# Patient Record
Sex: Female | Born: 1940
Health system: Southern US, Community
[De-identification: ages and names within clinical notes are randomized; demographics above are authoritative.]

## PROBLEM LIST (undated history)

## (undated) DIAGNOSIS — M549 Dorsalgia, unspecified: Secondary | ICD-10-CM

## (undated) DIAGNOSIS — I10 Essential (primary) hypertension: Secondary | ICD-10-CM

## (undated) DIAGNOSIS — E119 Type 2 diabetes mellitus without complications: Secondary | ICD-10-CM

## (undated) HISTORY — PX: ABDOMINAL HYSTERECTOMY: SHX81

## (undated) HISTORY — PX: CHOLECYSTECTOMY: SHX55

---

## 2018-02-08 ENCOUNTER — Emergency Department (HOSPITAL_BASED_OUTPATIENT_CLINIC_OR_DEPARTMENT_OTHER)
Admission: EM | Admit: 2018-02-08 | Discharge: 2018-02-08 | Disposition: A | Discharge status: Home / Self Care | Payer: Medicare Other | Attending: Emergency Medicine | Admitting: Emergency Medicine

## 2018-02-08 ENCOUNTER — Emergency Department (HOSPITAL_BASED_OUTPATIENT_CLINIC_OR_DEPARTMENT_OTHER): Payer: Medicare Other

## 2018-02-08 ENCOUNTER — Encounter (HOSPITAL_BASED_OUTPATIENT_CLINIC_OR_DEPARTMENT_OTHER): Payer: Self-pay | Admitting: *Deleted

## 2018-02-08 ENCOUNTER — Other Ambulatory Visit: Payer: Self-pay

## 2018-02-08 DIAGNOSIS — S3992XA Unspecified injury of lower back, initial encounter: Secondary | ICD-10-CM | POA: Diagnosis present

## 2018-02-08 DIAGNOSIS — I10 Essential (primary) hypertension: Secondary | ICD-10-CM | POA: Insufficient documentation

## 2018-02-08 DIAGNOSIS — Y9389 Activity, other specified: Secondary | ICD-10-CM | POA: Diagnosis not present

## 2018-02-08 DIAGNOSIS — Y999 Unspecified external cause status: Secondary | ICD-10-CM | POA: Insufficient documentation

## 2018-02-08 DIAGNOSIS — X509XXA Other and unspecified overexertion or strenuous movements or postures, initial encounter: Secondary | ICD-10-CM | POA: Insufficient documentation

## 2018-02-08 DIAGNOSIS — Y929 Unspecified place or not applicable: Secondary | ICD-10-CM | POA: Diagnosis not present

## 2018-02-08 DIAGNOSIS — S39012A Strain of muscle, fascia and tendon of lower back, initial encounter: Secondary | ICD-10-CM | POA: Insufficient documentation

## 2018-02-08 HISTORY — DX: Essential (primary) hypertension: I10

## 2018-02-08 HISTORY — DX: Dorsalgia, unspecified: M54.9

## 2018-02-08 HISTORY — DX: Type 2 diabetes mellitus without complications: E11.9

## 2018-02-08 MED ORDER — HYDROMORPHONE HCL 1 MG/ML IJ SOLN
1.0000 mg | Freq: Once | INTRAMUSCULAR | Status: AC
  Administered 2018-02-08: 1 mg via INTRAMUSCULAR
  Filled 2018-02-08: qty 1

## 2018-02-08 MED ORDER — METHYLPREDNISOLONE ACETATE 80 MG/ML IJ SUSP
80.0000 mg | Freq: Once | INTRAMUSCULAR | Status: AC
  Administered 2018-02-08: 80 mg via INTRAMUSCULAR
  Filled 2018-02-08: qty 1

## 2018-02-08 MED ORDER — KETOROLAC TROMETHAMINE 60 MG/2ML IM SOLN
30.0000 mg | Freq: Once | INTRAMUSCULAR | Status: AC
  Administered 2018-02-08: 30 mg via INTRAMUSCULAR
  Filled 2018-02-08: qty 2

## 2018-02-08 MED ORDER — BACLOFEN 10 MG PO TABS: 10.0000 mg | ORAL_TABLET | Freq: Three times a day (TID) | ORAL | 0 refills | Status: AC

## 2018-02-08 MED FILL — BACLOFEN 10 MG TABLET: 10 | 10 days supply | Qty: 30 | Fill #0

## 2018-02-08 NOTE — ED Notes (Signed)
NAD at this time. Pt is stable and going home.  

## 2018-02-08 NOTE — ED Triage Notes (Signed)
Pt has had severe pain in left leg & back since Sunday evening

## 2018-02-08 NOTE — ED Provider Notes (Signed)
[facility] EMERGENCY DEPARTMENT Provider Note   CSN: [id number] Arrival date & time: 02/08/18  5621   History   Chief Complaint Chief Complaint  Patient presents with  . Back Pain  . Leg Pain    HPI Amanda Nolan is a 77 y.o. female with a PMH of HTN, T2DM, HLD, IBS, and osteoarthritis of multiple sites presenting to the ED with back pain and left leg pain for the last week. The pain started when she was cleaning a spot off of her carpet one week ago. The pain got worse over the weekend because she spent a lot of time sitting. She states that the pain was so bad last night that she "was in tears". The pain is "sharp" and "stabbing". The pain starts in her low back and radiates to her left hip and thigh. The pain does not radiate down to her foot. She denies any weakness, numbness, or tingling in her lower extremities. She took Tylenol #3 and a muscle relaxer last night, which helped her sleep. She has also tried stretching, which has helped a little. She states this same thing happened 3-4 years ago. She had an MRI done at the neurosurgeon's office and was told she had a pinched nerve. At that time, the pain resolved on its own without any medications or interventions. She denies any trauma or falls.  No past medical history on file.  There are no active problems to display for this patient.      OB History   None      Home Medications    Prior to Admission medications   Not on File    Family History No family history on file.  Social History Social History   Tobacco Use  . Smoking status: Not on file  Substance Use Topics  . Alcohol use: Not on file  . Drug use: Not on file     Allergies   Patient has no allergy information on record.   Review of Systems Review of Systems  Constitutional: Negative for activity change, chills and fever.  HENT: Negative for congestion and sore throat.   Respiratory: Negative for shortness of breath.     Cardiovascular: Negative for chest pain.  Gastrointestinal: Negative for nausea and vomiting.  Genitourinary: Negative for dysuria and frequency.  Musculoskeletal: Positive for back pain. Negative for neck pain and neck stiffness.  Skin: Negative for rash.  Neurological: Negative for weakness and numbness.  Psychiatric/Behavioral: Negative for confusion.    Physical Exam Updated Vital Signs There were no vitals taken for this visit.  Physical Exam  Constitutional: She is oriented to person, place, and time. She appears well-developed and well-nourished.  HENT:  Head: Normocephalic and atraumatic.  Eyes: Conjunctivae and EOM are normal.  Neck: Normal range of motion. Neck supple.  Cardiovascular: Normal rate.  Pulmonary/Chest: Effort normal.  Abdominal: She exhibits no distension.  Musculoskeletal: Normal range of motion. She exhibits no tenderness.  No midline tenderness to palpation of the back, +tenderness of the left paraspinal muscles of the lumbar spine, +point tenderness over the left SI joint  Neurological: She is alert and oriented to person, place, and time. She has normal strength. She displays normal reflexes. No cranial nerve deficit or sensory deficit. She exhibits normal muscle tone.  Straight leg raise negative bilaterally  Skin: Skin is warm and dry. Capillary refill takes less than 2 seconds. No rash noted.  Psychiatric: She has a normal mood and affect. Her behavior is normal.  Judgment and thought content normal.    ED Treatments / Results  Labs (all labs ordered are listed, but only abnormal results are displayed) Labs Reviewed - No data to display  EKG None  Radiology No results found.  Procedures Procedures (including critical care time)  Medications Ordered in ED Medications - No data to display   Initial Impression / Assessment and Plan / ED Course  I have reviewed the triage vital signs and the nursing notes.  Pertinent labs & imaging  results that were available during my care of the patient were reviewed by me and considered in my medical decision making (see chart for details).  77 year old female with back pain that radiates to the upper left leg. Likely exacerbation underlying osteoarthritis of the lumbar spine vs left SI joint dysfunction. No fevers, chills, severe back pain that wakes her up at night to suggest concerning pathology such a tumor or abscess. No trauma or falls to suggest fracture. Will obtain lumbar x-ray to rule out compression fracture. Treat with Toradol IM and depomedrol IM for acute pain.  10:45AM: Patient with increased pain after returning from x-ray. Will give Dilaudid IM x 1.  11:00AM: Lumbar x-ray with mild degenerative changes. Patient feeling more comfortable. She is safe for discharge home. Will prescribe muscle relaxer that she can use in addition to Ibuprofen and Tylenol. She will follow-up with her neurosurgeon as an outpatient.  Final Clinical Impressions(s) / ED Diagnoses   Final diagnoses:  None    ED Discharge Orders    None       Danh Bayus, Allyn Kenner, MD 02/08/18 1110    Raeford Razor, MD 02/08/18 1547

## 2018-02-08 NOTE — Discharge Instructions (Addendum)
It was so nice to meet you!  You came into the emergency department because you were having back and left leg pain. You may have some nerve irritation coming from your back. We gave you three shots today to help with your pain. We did some x-rays of your lower back which showed some mild arthritis. I have prescribed a muscle relaxer called Baclofen that you can use three times per day as needed. You should also use Ibuprofen, Tylenol, and heat.   Please make sure you follow-up with the neurosurgeon that you have seen in the past.

## 2018-02-10 ENCOUNTER — Emergency Department (HOSPITAL_BASED_OUTPATIENT_CLINIC_OR_DEPARTMENT_OTHER)
Admission: EM | Admit: 2018-02-10 | Discharge: 2018-02-10 | Disposition: A | Discharge status: Home / Self Care | Payer: Medicare Other | Attending: Emergency Medicine | Admitting: Emergency Medicine

## 2018-02-10 ENCOUNTER — Encounter (HOSPITAL_BASED_OUTPATIENT_CLINIC_OR_DEPARTMENT_OTHER): Payer: Self-pay | Admitting: *Deleted

## 2018-02-10 ENCOUNTER — Emergency Department (HOSPITAL_BASED_OUTPATIENT_CLINIC_OR_DEPARTMENT_OTHER): Payer: Medicare Other

## 2018-02-10 ENCOUNTER — Other Ambulatory Visit: Payer: Self-pay

## 2018-02-10 DIAGNOSIS — I10 Essential (primary) hypertension: Secondary | ICD-10-CM | POA: Diagnosis not present

## 2018-02-10 DIAGNOSIS — E1140 Type 2 diabetes mellitus with diabetic neuropathy, unspecified: Secondary | ICD-10-CM | POA: Insufficient documentation

## 2018-02-10 DIAGNOSIS — R600 Localized edema: Secondary | ICD-10-CM | POA: Insufficient documentation

## 2018-02-10 DIAGNOSIS — Z79899 Other long term (current) drug therapy: Secondary | ICD-10-CM | POA: Insufficient documentation

## 2018-02-10 DIAGNOSIS — M79605 Pain in left leg: Secondary | ICD-10-CM | POA: Diagnosis present

## 2018-02-10 DIAGNOSIS — M792 Neuralgia and neuritis, unspecified: Secondary | ICD-10-CM

## 2018-02-10 MED ORDER — FENTANYL CITRATE (PF) 100 MCG/2ML IJ SOLN
50.0000 ug | Freq: Once | INTRAMUSCULAR | Status: AC
  Administered 2018-02-10: 50 ug via INTRAMUSCULAR
  Filled 2018-02-10: qty 2

## 2018-02-10 MED ORDER — HYDROCODONE-ACETAMINOPHEN 5-325 MG PO TABS
1.0000 | ORAL_TABLET | Freq: Once | ORAL | Status: AC
  Administered 2018-02-10: 1 via ORAL
  Filled 2018-02-10: qty 1

## 2018-02-10 MED ORDER — PREDNISONE 10 MG PO TABS: 40.0000 mg | ORAL_TABLET | Freq: Every day | ORAL | 0 refills | Status: AC

## 2018-02-10 MED ORDER — GABAPENTIN 100 MG PO CAPS: 100.0000 mg | ORAL_CAPSULE | Freq: Three times a day (TID) | ORAL | 0 refills | Status: AC

## 2018-02-10 MED ORDER — PREDNISONE 50 MG PO TABS
60.0000 mg | ORAL_TABLET | Freq: Once | ORAL | Status: AC
  Administered 2018-02-10: 60 mg via ORAL
  Filled 2018-02-10: qty 1

## 2018-02-10 MED ORDER — GABAPENTIN 300 MG PO CAPS
300.0000 mg | ORAL_CAPSULE | Freq: Once | ORAL | Status: AC
  Administered 2018-02-10: 300 mg via ORAL
  Filled 2018-02-10: qty 1

## 2018-02-10 NOTE — ED Notes (Signed)
Nurse first-husband requested assist to get pt out of car-arrived to front passenger seat of car to find pt seated-c/o back pain-pt was able to get out of car without assist-stood and sat in w/c-taken in to ED [facility] lobby via w/c

## 2018-02-10 NOTE — ED Notes (Signed)
Patient transported to Ultrasound 

## 2018-02-10 NOTE — ED Notes (Signed)
ED Provider at bedside. 

## 2018-02-10 NOTE — ED Provider Notes (Signed)
[facility] EMERGENCY DEPARTMENT Provider Note   CSN: [id number] Arrival date & time: 02/10/18  1342     History   Chief Complaint Chief Complaint  Patient presents with  . Back Pain    HPI Amanda Nolan is a 77 y.o. female with past medical history significant for diabetes, hypertension, chronic back pain stenting with atraumatic left lower extremity pain and left lower back pain.  She explains that she was here 3 days ago and seen for same but it was not as severe.  She has been experiencing these episodes for years but this time has been worse.  She states that it started last week on Friday she had a family wedding in which she sat for prolonged period of time and on Sunday a recital she was seated for hours and started experiencing pain later that day.  Prior to this past weekend she was feeling very healthy and active without any ill symptoms.  She was able to manage the pain at home but on Wednesday she could not bear it anymore and came in to be evaluated in the emergency department.  She reports receiving 3 injections which helped her symptoms and was able to be discharged home.  She was prescribed muscle relaxant which she has been taking 3 times a day and has slept all day yesterday.  Today she states that she could not control the pain well enough to get rest and the pain has been unbearable.  She explains that her leg pain is what bothers her the most as it has never been this bad before.  Points to her left groin and inner thigh. Denies any fever, chills, loss of bowel or bladder function, numbness or weakness.   HPI  Past Medical History:  Diagnosis Date  . Back pain   . Diabetes mellitus without complication (HCC)   . Hypertension     There are no active problems to display for this patient.   Past Surgical History:  Procedure Laterality Date  . ABDOMINAL HYSTERECTOMY    . CHOLECYSTECTOMY       OB History   None      Home Medications    Prior  to Admission medications   Medication Sig Start Date End Date Taking? Authorizing Provider  atenolol (TENORMIN) 50 MG tablet Take 50 mg by mouth daily.    [provider]  baclofen (LIORESAL) 10 MG tablet Take 1 tablet (10 mg total) by mouth 3 (three) times daily. 02/08/18 02/08/19  Mayo, Allyn Kenner, MD  gabapentin (NEURONTIN) 100 MG capsule Take 1 capsule (100 mg total) by mouth 3 (three) times daily. 02/10/18   Georgiana Shore, PA-C  ibuprofen (ADVIL,MOTRIN) 800 MG tablet Take 800 mg by mouth every 8 (eight) hours as needed.    [provider]  predniSONE (DELTASONE) 10 MG tablet Take 4 tablets (40 mg total) by mouth daily for 4 days. 02/10/18 02/14/18  Georgiana Shore, PA-C    Family History History reviewed. No pertinent family history.  Social History Social History   Tobacco Use  . Smoking status: Never Smoker  . Smokeless tobacco: Never Used  Substance Use Topics  . Alcohol use: Not Currently  . Drug use: Never     Allergies   Metformin and related and Procardia [nifedipine]   Review of Systems Review of Systems  Constitutional: Negative for chills, diaphoresis, fatigue and fever.  Respiratory: Negative for chest tightness, shortness of breath and wheezing.   Cardiovascular: Positive for  leg swelling. Negative for chest pain and palpitations.  Gastrointestinal: Negative for abdominal distention, abdominal pain, diarrhea, nausea and vomiting.  Genitourinary: Negative for difficulty urinating, dysuria, flank pain, frequency and pelvic pain.  Musculoskeletal: Positive for arthralgias and back pain. Negative for joint swelling, myalgias, neck pain and neck stiffness.  Skin: Negative for color change, pallor and rash.  Neurological: Negative for weakness and numbness.     Physical Exam Updated Vital Signs BP (!) 159/62 (BP Location: Right Arm)   Pulse 65   Temp 98.4 F (36.9 C)   Resp 20   Ht  (1.549 m)   Wt 79.4 kg (175 lb)   SpO2 97%    BMI 33.07 kg/m   Physical Exam  Constitutional: She appears well-developed and well-nourished. No distress.  Afebrile, nontoxic-appearing, lying in bed in apparent discomfort.  HENT:  Head: Normocephalic and atraumatic.  Eyes: Conjunctivae are normal.  Neck: Normal range of motion. Neck supple.  Cardiovascular: Normal rate, regular rhythm, normal heart sounds and intact distal pulses.  No murmur heard. Pulmonary/Chest: Effort normal and breath sounds normal. No stridor. No respiratory distress. She has no wheezes. She has no rales.  Abdominal: She exhibits no distension.  Musculoskeletal: Normal range of motion. She exhibits edema and tenderness. She exhibits no deformity.  Left lower extremity appears more edematous than the right.  Tender to palpation of the left groin without bulging or evidence of hernia.  Tenderness palpation of the left calf.  Neurological: She is alert. No sensory deficit. She exhibits normal muscle tone.  5/5 strength in lower extremities bilaterally.  Sensation and proprioception intact, strong dorsalis pedis pulses, Neurovascularly intact.  Skin: Skin is warm and dry. No rash noted. She is not diaphoretic. No erythema. No pallor.  Psychiatric: She has a normal mood and affect.  Nursing note and vitals reviewed.    ED Treatments / Results  Labs (all labs ordered are listed, but only abnormal results are displayed) Labs Reviewed - No data to display  EKG None  Radiology US Venous Img Lower Unilateral Left  Result Date: 02/10/2018 CLINICAL DATA:  Left groin pain for 5 days. EXAM: LEFT LOWER EXTREMITY VENOUS DOPPLER ULTRASOUND TECHNIQUE: Gray-scale sonography with graded compression, as well as color Doppler and duplex ultrasound were performed to evaluate the lower extremity deep venous systems from the level of the common femoral vein and including the common femoral, femoral, profunda femoral, popliteal and calf veins including the posterior tibial,  peroneal and gastrocnemius veins when visible. The superficial great saphenous vein was also interrogated. Spectral Doppler was utilized to evaluate flow at rest and with distal augmentation maneuvers in the common femoral, femoral and popliteal veins. COMPARISON:  None. FINDINGS: Contralateral Common Femoral Vein: Respiratory phasicity is normal and symmetric with the symptomatic side. No evidence of thrombus. Normal compressibility. Common Femoral Vein: No evidence of thrombus. Normal compressibility, respiratory phasicity and response to augmentation. Saphenofemoral Junction: No evidence of thrombus. Normal compressibility and flow on color Doppler imaging. Profunda Femoral Vein: No evidence of thrombus. Normal compressibility and flow on color Doppler imaging. Femoral Vein: No evidence of thrombus. Normal compressibility, respiratory phasicity and response to augmentation. Popliteal Vein: No evidence of thrombus. Normal compressibility, respiratory phasicity and response to augmentation. Calf Veins: No evidence of thrombus. Normal compressibility and flow on color Doppler imaging. Superficial Great Saphenous Vein: No evidence of thrombus. Normal compressibility. Venous Reflux:  None. Other Findings:  None. IMPRESSION: Negative for deep venous thrombosis in left lower extremity. Electronically Signed  By: Richarda Overlie M.D.   On: 02/10/2018 18:08    Procedures Procedures (including critical care time)  Medications Ordered in ED Medications  HYDROcodone-acetaminophen (NORCO/VICODIN) 5-325 MG per tablet 1 tablet (1 tablet Oral Given 02/10/18 1547)  fentaNYL (SUBLIMAZE) injection 50 mcg (50 mcg Intramuscular Given 02/10/18 1608)  predniSONE (DELTASONE) tablet 60 mg (60 mg Oral Given 02/10/18 1847)  gabapentin (NEURONTIN) capsule 300 mg (300 mg Oral Given 02/10/18 1847)     Initial Impression / Assessment and Plan / ED Course  I have reviewed the triage vital signs and the nursing notes.  Pertinent labs &  imaging results that were available during my care of the patient were reviewed by me and considered in my medical decision making (see chart for details).    Patient presenting with left lower extremity pain and is a poor historian in obvious discomfort.  No injury or trauma. Recent visit 2 days ago with lumbar films showing mild degenerative disc disease, which patient reports being aware. Her symptoms started after a weekend consisting of prolonged sitting.  Patient is having difficulty clearly explain where her pain is located and points to her groin but reports pain that is generalized in the left lower extremity with calf tenderness. 5/5 strength in lower extremities bilaterally, extremities are warm with strong dorsalis pedis pulses and neurovascularly intact.  I do not suspect peripheral arterial disease.  Patient was provided with analgesia. She improved while in the ED. She was able to transfer from bed to wheel chair and use the bathroom without difficulties.  Will obtain ultrasound to rule out DVT of the left lower extremity.  Negative for DVT of the left LE.  Patients symptoms are consistent with neuropathic pain. Will give prednisone and start gabapentin. Patient was given initial dose in the emergency department.  Will discharge home with symptomatic relief and close follow-up with PCP.  Patient was discussed with Dr. Jeraldine Loots who has seen patient and agrees with assessment and plan. Final Clinical Impressions(s) / ED Diagnoses   Final diagnoses:  Neuropathic pain    ED Discharge Orders        Ordered    gabapentin (NEURONTIN) 100 MG capsule  3 times daily     02/10/18 1840    predniSONE (DELTASONE) 10 MG tablet  Daily     02/10/18 1840       Gregary Cromer 02/10/18 Dolan Amen, MD 02/11/18 0002

## 2018-02-10 NOTE — Discharge Instructions (Signed)
As discussed, your ultrasound was negative for a blood clot in your left leg. You were given your first dose of prednisone and gabapentin while in the emergency department.  You may start taking your prescribed medications tomorrow.  Discontinue baclofen.  Start gabapentin 100 mg 3 times daily.  Prednisone 40 mg once daily for the next 4 days.  Monitor your blood glucose closely and discontinue prednisone if your blood sugars become out of control.  Follow-up with your primary care provider.  Return to the emergency department if symptoms worsen or new concerning symptoms in the meantime.

## 2018-02-10 NOTE — ED Triage Notes (Addendum)
Pt c/o lower left back pain which radiates down left leg x 1 week seen here 2 days ago for same, pt states she need not receive a prescription for pain medication and she needs some

## 2019-04-26 IMAGING — DX DG LUMBAR SPINE COMPLETE 4+V
5 series · 5 of 5 positions shown · non-contrast
Comparison: None.

CLINICAL DATA: Chronic low back pain. New left leg radiculopathy
since [REDACTED].

EXAM:
LUMBAR SPINE - COMPLETE 4+ VIEW

[l-spine ap]
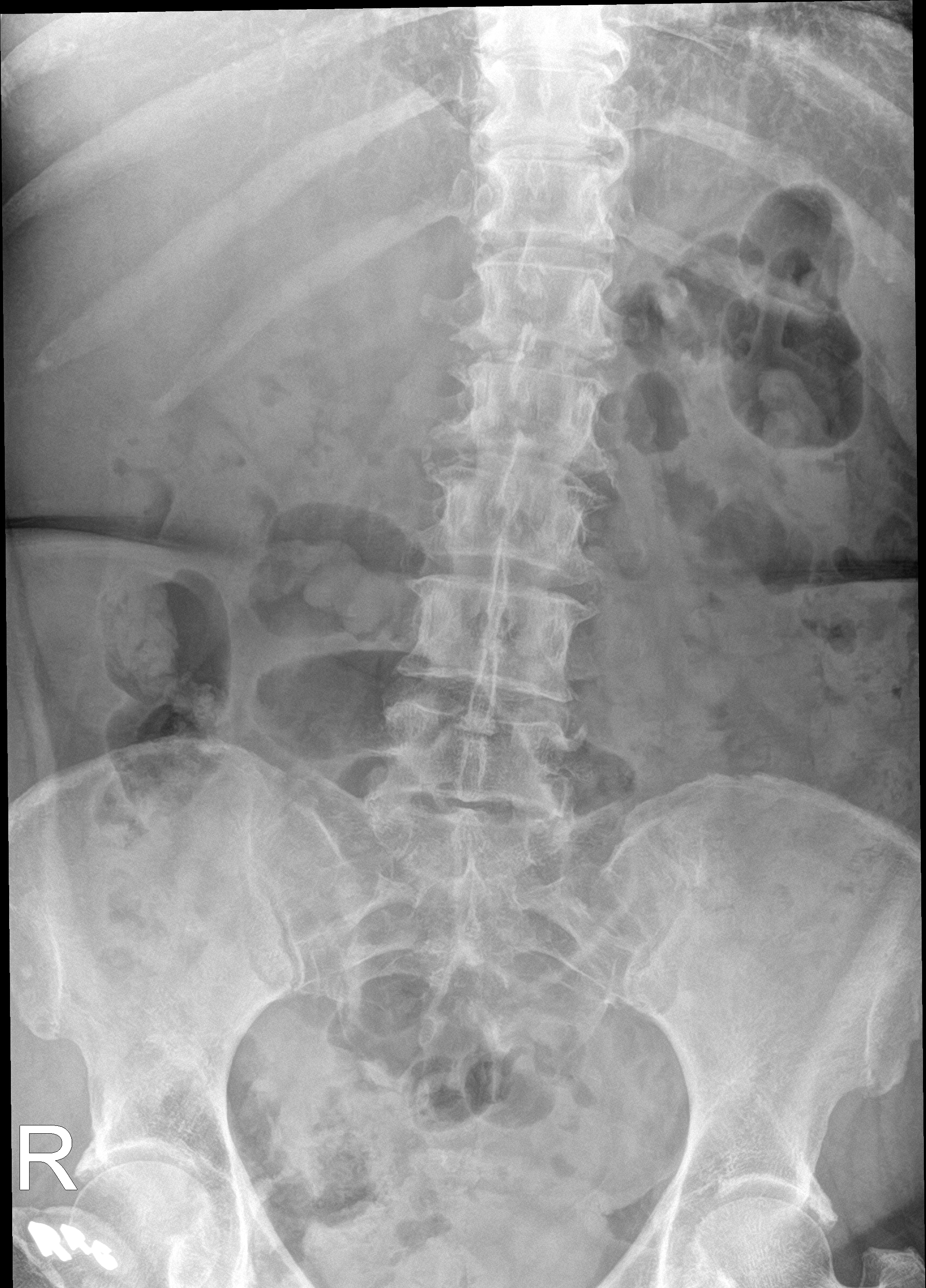

[l-spine obl (1 of 2)]
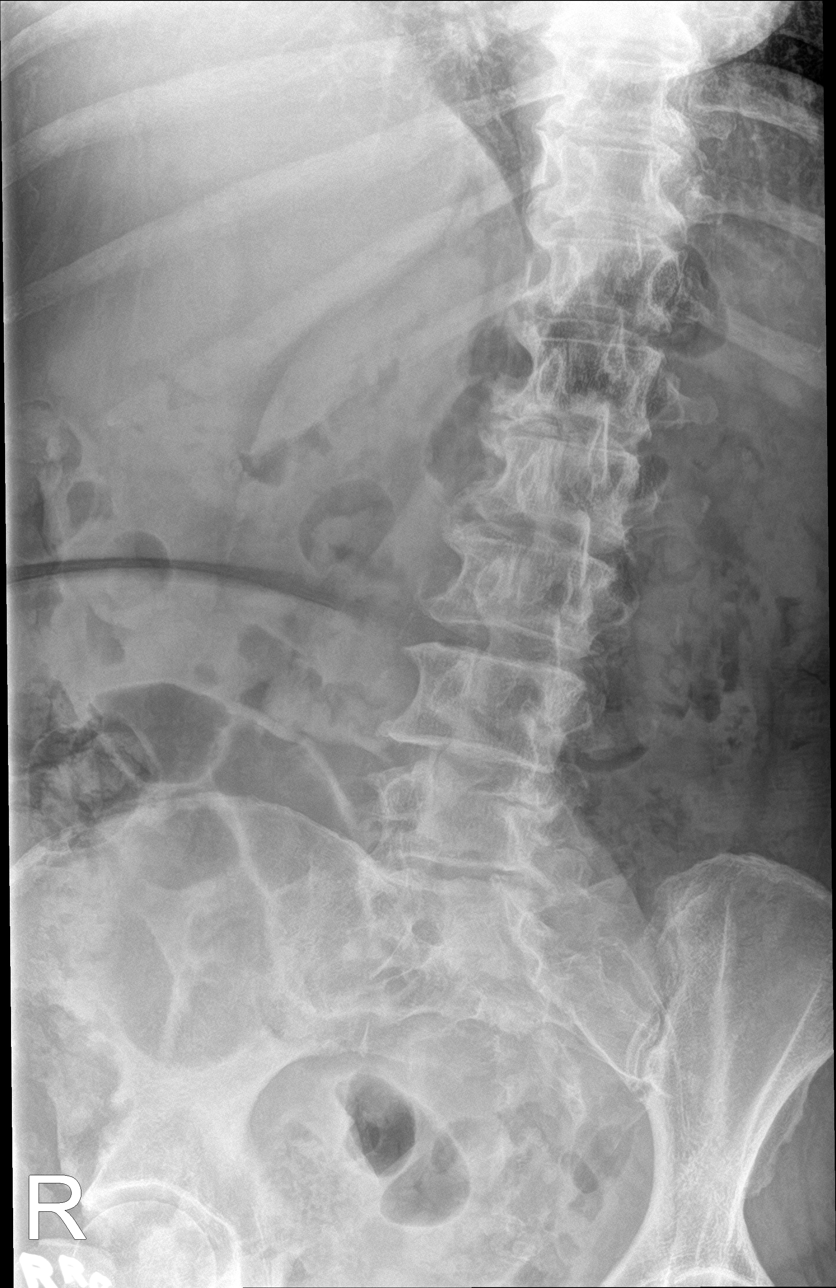

[l-spine obl (2 of 2)]
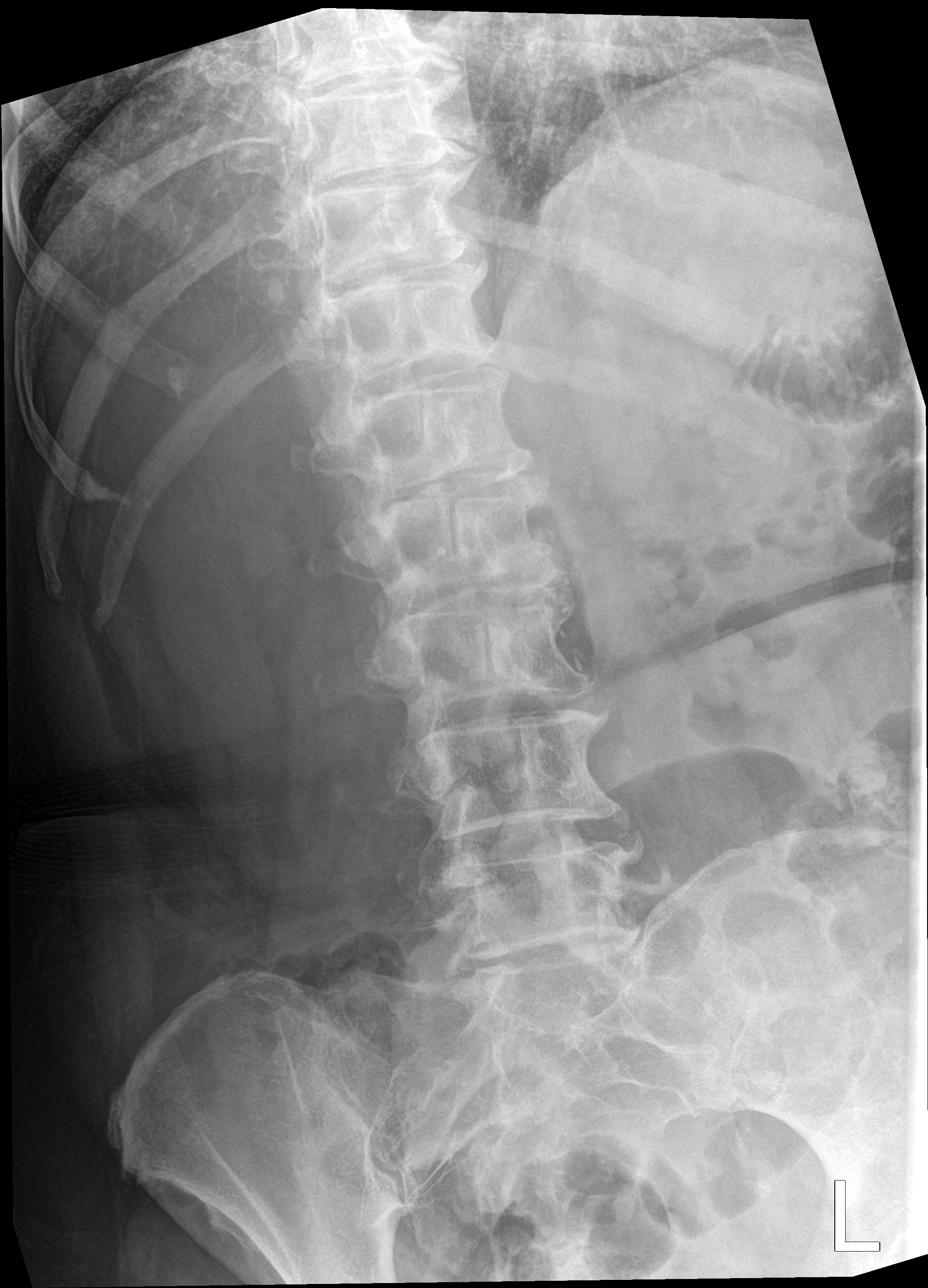

[l-spine lat]
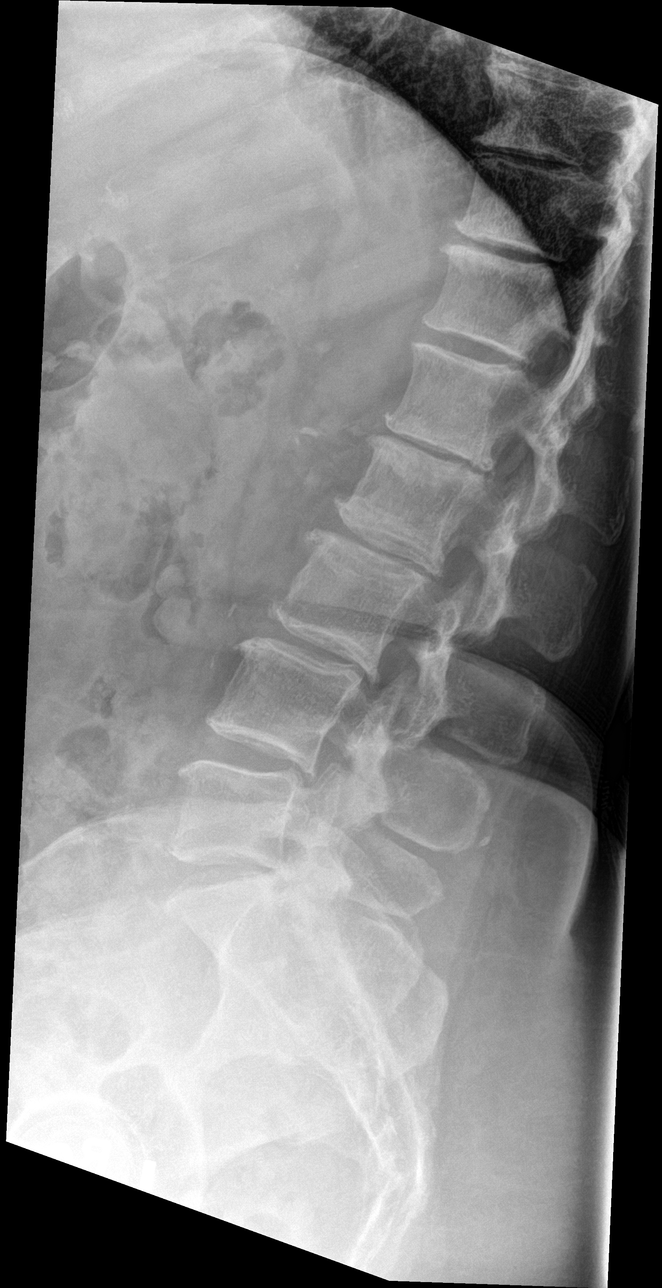

[l-spine spot]
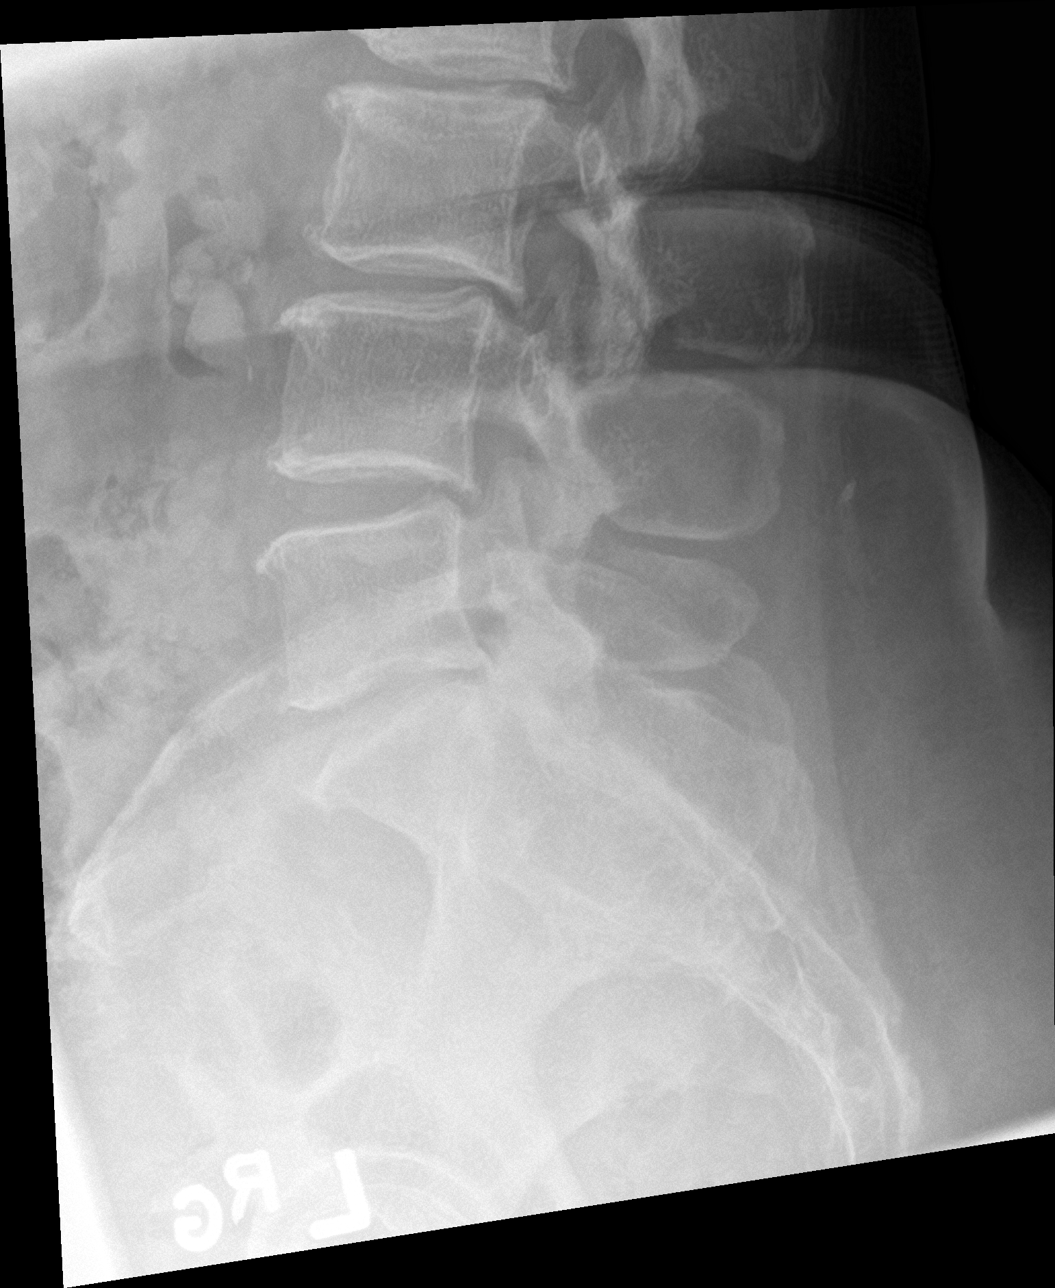

[5 of 5 positions shown; findings below may reference images not displayed]

FINDINGS: Five lumbar type vertebral bodies. No acute fracture or subluxation.
Vertebral body heights are preserved. Trace stepwise retrolisthesis
at L3-L4 and L4-L5. Mild disc height loss and endplate spurring from
L1-L2 through L3-L4. The sacroiliac joints are intact.
IMPRESSION: Mild degenerative changes of the lumbar spine. No acute osseous
abnormality.

## 2019-07-20 IMAGING — US US EXTREM LOW VENOUS*L*
1 series · 13 of 24 positions shown · non-contrast
Comparison: None.

CLINICAL DATA: Left groin pain for 5 days.



[Series 1: us extrem low venous*left* · 0.08mm/px · 13 of 35 slices shown]
[im 1/35]
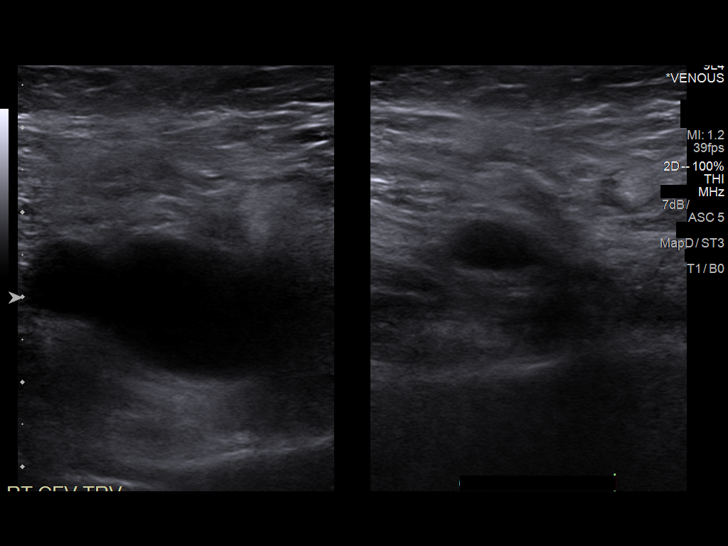
[im 3/35]
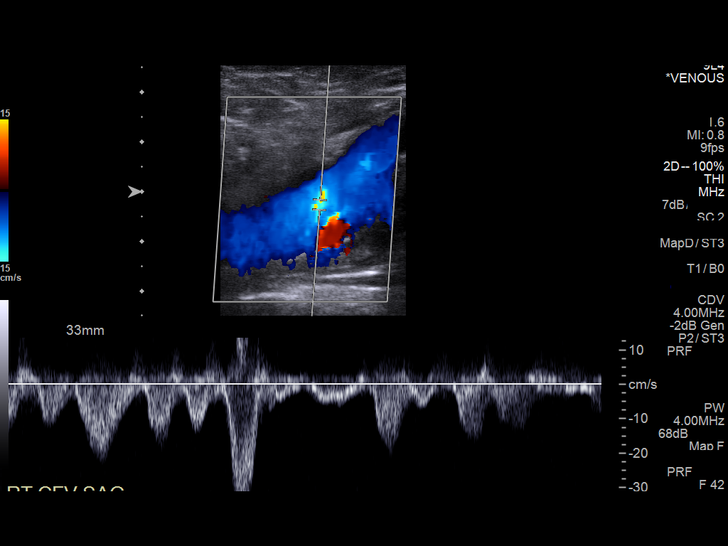
[im 6/35]
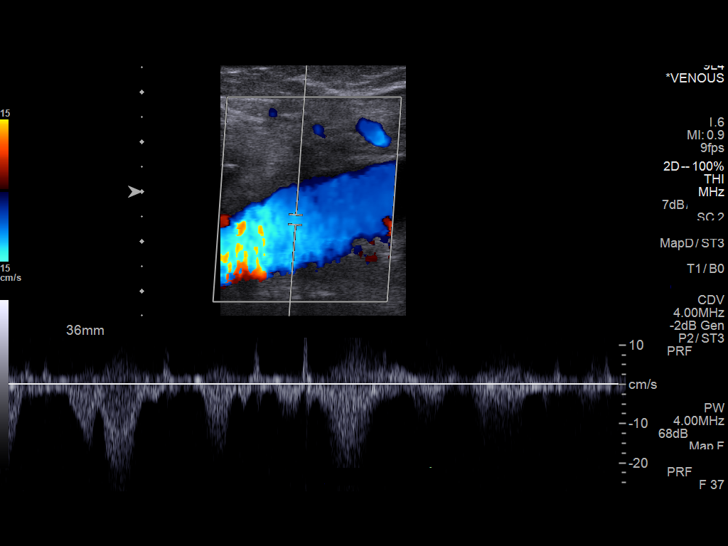
[im 9/35]
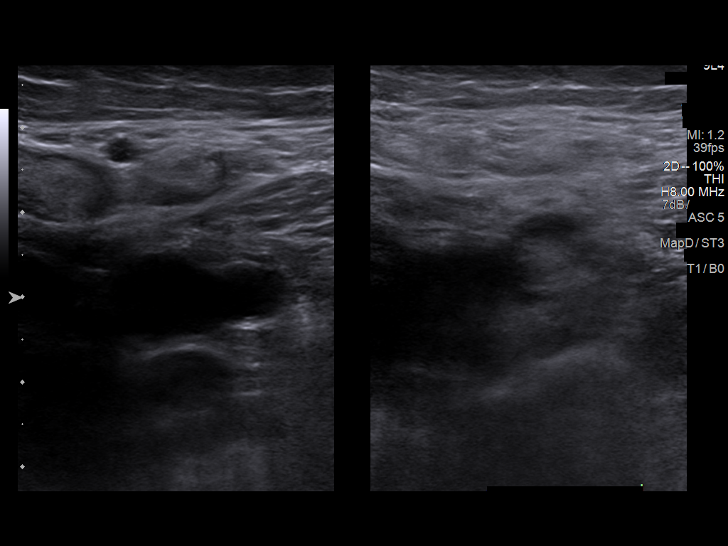
[im 12/35]
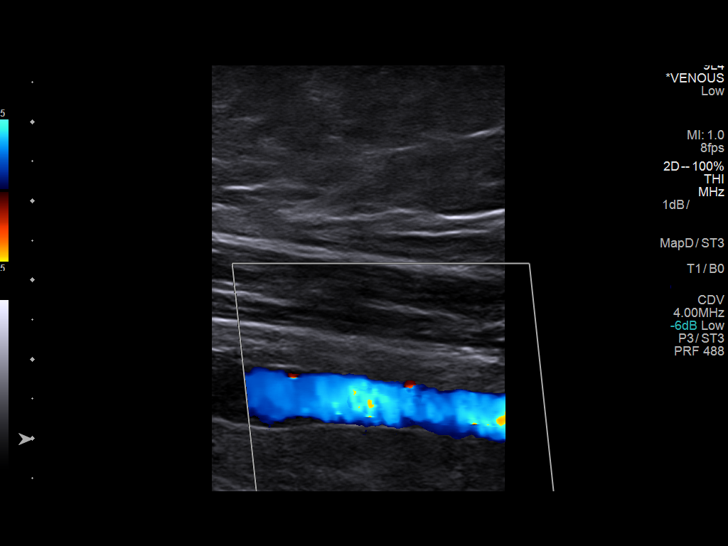
[im 15/35]
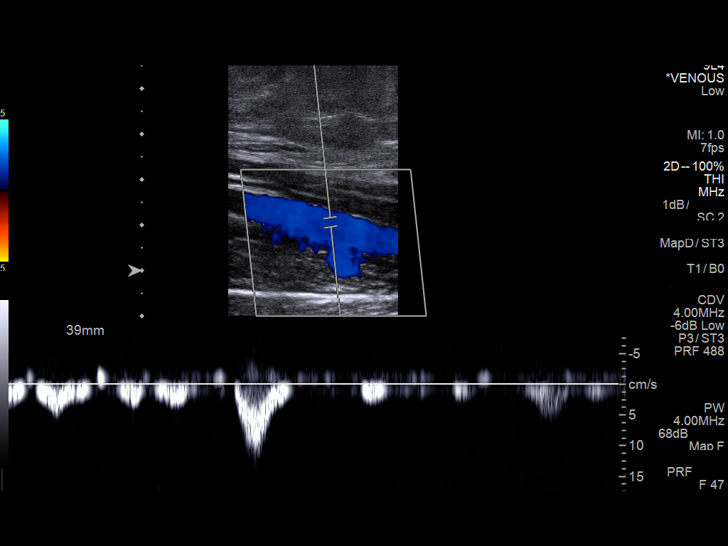
[im 18/35]
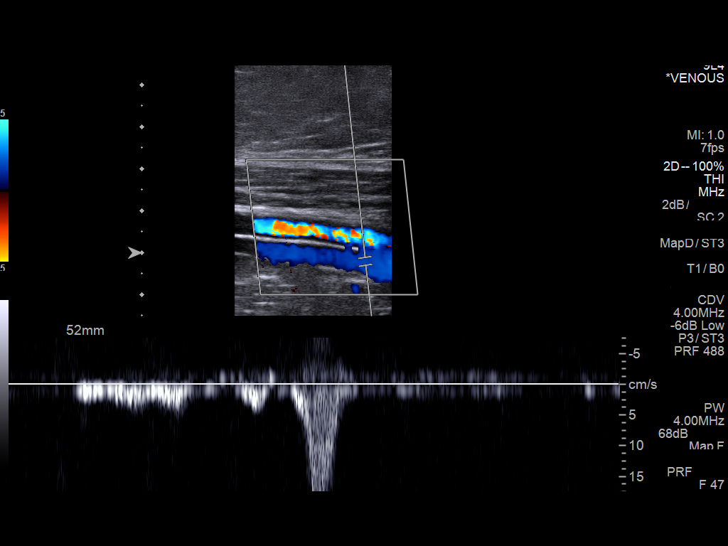
[im 20/35]
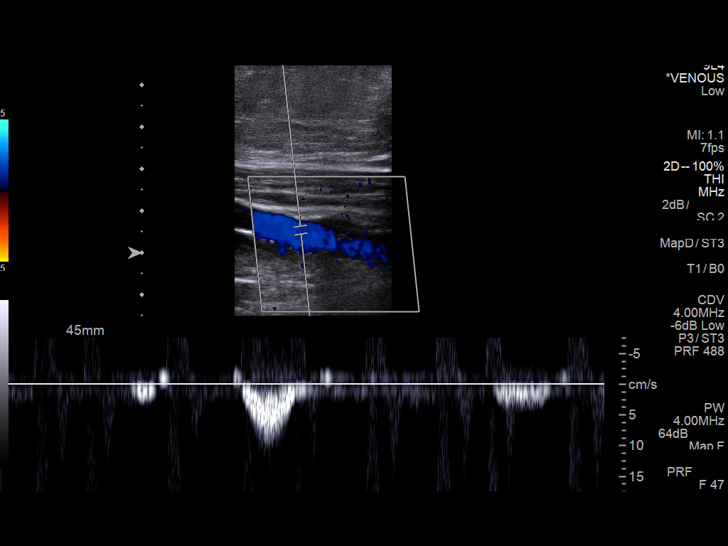
[im 23/35]
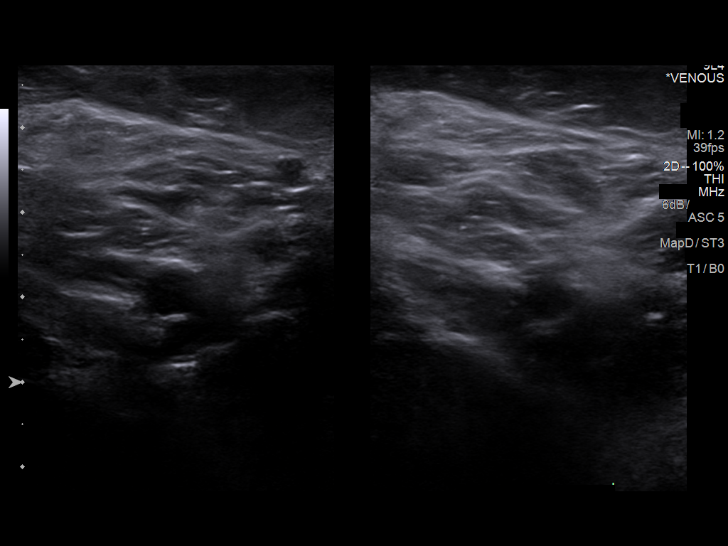
[im 26/35]
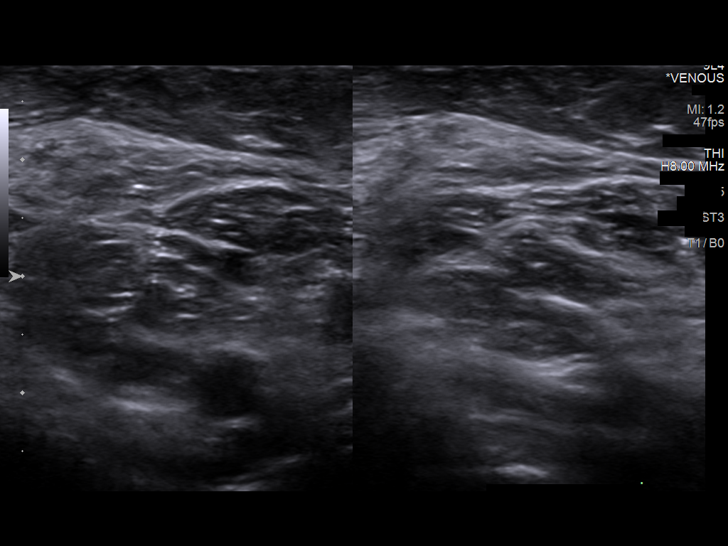
[im 29/35]
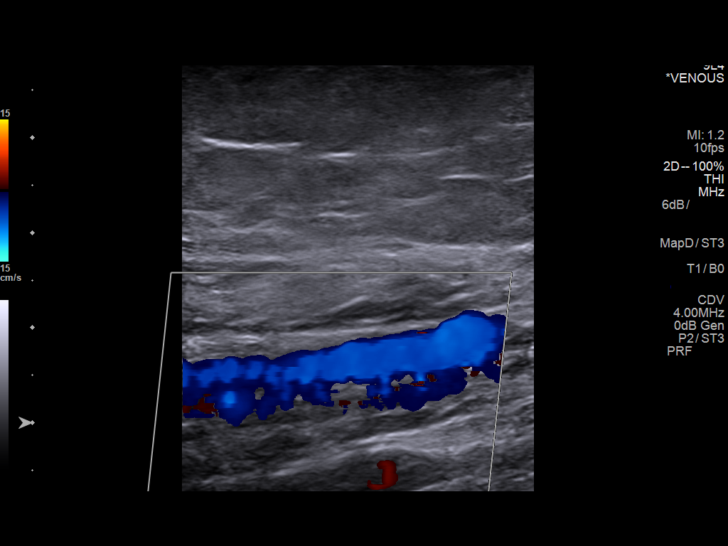
[im 32/35]
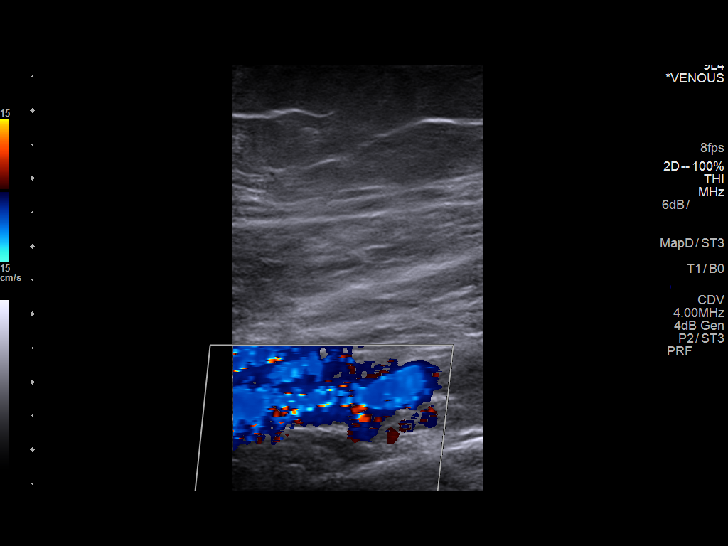
[im 35/35]
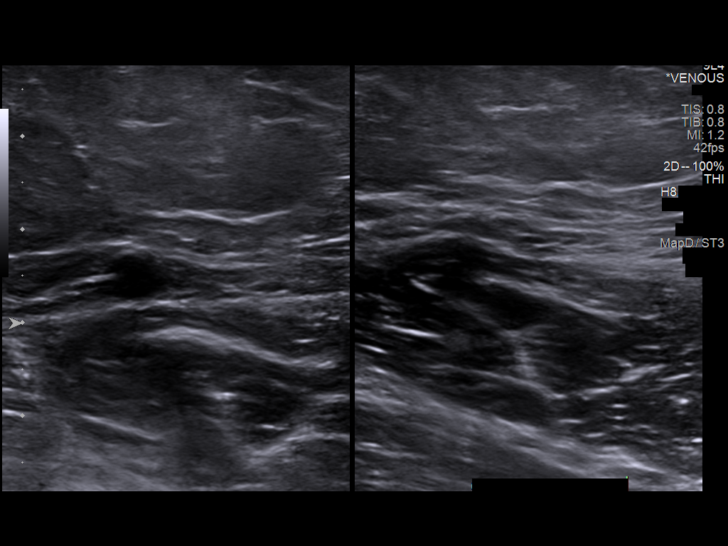

[13 of 24 positions shown; findings below may reference images not displayed]

FINDINGS: Contralateral Common Femoral Vein: Respiratory phasicity is normal
and symmetric with the symptomatic side. No evidence of thrombus.
Normal compressibility.

Common Femoral Vein: No evidence of thrombus. Normal
compressibility, respiratory phasicity and response to augmentation.

Saphenofemoral Junction: No evidence of thrombus. Normal
compressibility and flow on color Doppler imaging.

Profunda Femoral Vein: No evidence of thrombus. Normal
compressibility and flow on color Doppler imaging.

Femoral Vein: No evidence of thrombus. Normal compressibility,
respiratory phasicity and response to augmentation.

Popliteal Vein: No evidence of thrombus. Normal compressibility,
respiratory phasicity and response to augmentation.

Calf Veins: No evidence of thrombus. Normal compressibility and flow
on color Doppler imaging.

Superficial Great Saphenous Vein: No evidence of thrombus. Normal
compressibility.

Venous Reflux:  None.

Other Findings:  None.
IMPRESSION: Negative for deep venous thrombosis in left lower extremity.
